# Patient Record
Sex: Female | Born: 1955 | Race: Black or African American | Hispanic: No | Smoking: Never smoker
Health system: Southern US, Community
[De-identification: ages and names within clinical notes are randomized; demographics above are authoritative.]

## PROBLEM LIST (undated history)

## (undated) DIAGNOSIS — Z9889 Other specified postprocedural states: Secondary | ICD-10-CM

## (undated) DIAGNOSIS — H409 Unspecified glaucoma: Secondary | ICD-10-CM

## (undated) HISTORY — DX: Other specified postprocedural states: Z98.890

---

## 1998-08-06 ENCOUNTER — Other Ambulatory Visit: Admission: RE | Admit: 1998-08-06 | Discharge: 1998-08-06 | Payer: Self-pay | Admitting: *Deleted

## 2000-02-08 ENCOUNTER — Other Ambulatory Visit: Admission: RE | Admit: 2000-02-08 | Discharge: 2000-02-08 | Payer: Self-pay | Admitting: *Deleted

## 2001-02-13 ENCOUNTER — Other Ambulatory Visit: Admission: RE | Admit: 2001-02-13 | Discharge: 2001-02-13 | Payer: Self-pay | Admitting: Obstetrics and Gynecology

## 2002-03-02 ENCOUNTER — Other Ambulatory Visit: Admission: RE | Admit: 2002-03-02 | Discharge: 2002-03-02 | Payer: Self-pay | Admitting: Obstetrics and Gynecology

## 2003-06-03 ENCOUNTER — Other Ambulatory Visit: Admission: RE | Admit: 2003-06-03 | Discharge: 2003-06-03 | Payer: Self-pay | Admitting: Obstetrics and Gynecology

## 2009-02-25 ENCOUNTER — Ambulatory Visit (HOSPITAL_BASED_OUTPATIENT_CLINIC_OR_DEPARTMENT_OTHER): Admission: RE | Admit: 2009-02-25 | Discharge: 2009-02-25 | Payer: Self-pay | Admitting: Orthopedic Surgery

## 2010-07-15 LAB — POCT HEMOGLOBIN-HEMACUE: Hemoglobin: 12.9 g/dL (ref 12.0–15.0)

## 2015-05-13 ENCOUNTER — Encounter (HOSPITAL_COMMUNITY): Payer: Self-pay | Admitting: *Deleted

## 2015-05-13 ENCOUNTER — Emergency Department (HOSPITAL_COMMUNITY)
Admission: EM | Admit: 2015-05-13 | Discharge: 2015-05-13 | Disposition: A | Discharge status: Home / Self Care | Payer: Federal, State, Local not specified - PPO | Attending: Emergency Medicine | Admitting: Emergency Medicine

## 2015-05-13 ENCOUNTER — Emergency Department (HOSPITAL_COMMUNITY): Payer: Federal, State, Local not specified - PPO

## 2015-05-13 DIAGNOSIS — S0093XA Contusion of unspecified part of head, initial encounter: Secondary | ICD-10-CM

## 2015-05-13 DIAGNOSIS — S060X0A Concussion without loss of consciousness, initial encounter: Secondary | ICD-10-CM | POA: Insufficient documentation

## 2015-05-13 DIAGNOSIS — Y9389 Activity, other specified: Secondary | ICD-10-CM | POA: Diagnosis not present

## 2015-05-13 DIAGNOSIS — Z79899 Other long term (current) drug therapy: Secondary | ICD-10-CM | POA: Insufficient documentation

## 2015-05-13 DIAGNOSIS — Y998 Other external cause status: Secondary | ICD-10-CM | POA: Insufficient documentation

## 2015-05-13 DIAGNOSIS — W2209XA Striking against other stationary object, initial encounter: Secondary | ICD-10-CM | POA: Diagnosis not present

## 2015-05-13 DIAGNOSIS — H409 Unspecified glaucoma: Secondary | ICD-10-CM | POA: Insufficient documentation

## 2015-05-13 DIAGNOSIS — S0990XA Unspecified injury of head, initial encounter: Secondary | ICD-10-CM | POA: Diagnosis present

## 2015-05-13 DIAGNOSIS — Y92481 Parking lot as the place of occurrence of the external cause: Secondary | ICD-10-CM | POA: Diagnosis not present

## 2015-05-13 DIAGNOSIS — S0083XA Contusion of other part of head, initial encounter: Secondary | ICD-10-CM | POA: Insufficient documentation

## 2015-05-13 HISTORY — DX: Unspecified glaucoma: H40.9

## 2015-05-13 MED ORDER — ACETAMINOPHEN 325 MG PO TABS
650.0000 mg | ORAL_TABLET | Freq: Once | ORAL | Status: AC
  Administered 2015-05-13: 650 mg via ORAL
  Filled 2015-05-13: qty 2

## 2015-05-13 NOTE — ED Notes (Signed)
Patient was alert, oriented and stable upon discharge. RN went over AVS and patient had no further questions.  

## 2015-05-13 NOTE — Discharge Instructions (Signed)
You were seen in the emergency room today for evaluation of a head injury. Your CT scans were normal. You likely have a mild concussion. You may take tylenol or Aleve at home as needed for headache. Please follow up with a primary care provider (see the list below if you do not have one) within one week. Return to the ER for new or worsening symptoms such as severe headache, intractable vomiting, etc.   Concussion, Adult A concussion, or closed-head injury, is a brain injury caused by a direct blow to the head or by a quick and sudden movement (jolt) of the head or neck. Concussions are usually not life-threatening. Even so, the effects of a concussion can be serious. If you have had a concussion before, you are more likely to experience concussion-like symptoms after a direct blow to the head.  CAUSES  Direct blow to the head, such as from running into another player during a soccer game, being hit in a fight, or hitting your head on a hard surface.  A jolt of the head or neck that causes the brain to move back and forth inside the skull, such as in a car crash. SIGNS AND SYMPTOMS The signs of a concussion can be hard to notice. Early on, they may be missed by you, family members, and health care providers. You may look fine but act or feel differently. Symptoms are usually temporary, but they may last for days, weeks, or even longer. Some symptoms may appear right away while others may not show up for hours or days. Every head injury is different. Symptoms include:  Mild to moderate headaches that will not go away.  A feeling of pressure inside your head.  Having more trouble than usual:  Learning or remembering things you have heard.  Answering questions.  Paying attention or concentrating.  Organizing daily tasks.  Making decisions and solving problems.  Slowness in thinking, acting or reacting, speaking, or reading.  Getting lost or being easily confused.  Feeling tired all the  time or lacking energy (fatigued).  Feeling drowsy.  Sleep disturbances.  Sleeping more than usual.  Sleeping less than usual.  Trouble falling asleep.  Trouble sleeping (insomnia).  Loss of balance or feeling lightheaded or dizzy.  Nausea or vomiting.  Numbness or tingling.  Increased sensitivity to:  Sounds.  Lights.  Distractions.  Vision problems or eyes that tire easily.  Diminished sense of taste or smell.  Ringing in the ears.  Mood changes such as feeling sad or anxious.  Becoming easily irritated or angry for little or no reason.  Lack of motivation.  Seeing or hearing things other people do not see or hear (hallucinations). DIAGNOSIS Your health care provider can usually diagnose a concussion based on a description of your injury and symptoms. He or she will ask whether you passed out (lost consciousness) and whether you are having trouble remembering events that happened right before and during your injury. Your evaluation might include:  A brain scan to look for signs of injury to the brain. Even if the test shows no injury, you may still have a concussion.  Blood tests to be sure other problems are not present. TREATMENT  Concussions are usually treated in an emergency department, in urgent care, or at a clinic. You may need to stay in the hospital overnight for further treatment.  Tell your health care provider if you are taking any medicines, including prescription medicines, over-the-counter medicines, and natural remedies. Some medicines,  such as blood thinners (anticoagulants) and aspirin, may increase the chance of complications. Also tell your health care provider whether you have had alcohol or are taking illegal drugs. This information may affect treatment.  Your health care provider will send you home with important instructions to follow.  How fast you will recover from a concussion depends on many factors. These factors include how  severe your concussion is, what part of your brain was injured, your age, and how healthy you were before the concussion.  Most people with mild injuries recover fully. Recovery can take time. In general, recovery is slower in older persons. Also, persons who have had a concussion in the past or have other medical problems may find that it takes longer to recover from their current injury. HOME CARE INSTRUCTIONS General Instructions  Carefully follow the directions your health care provider gave you.  Only take over-the-counter or prescription medicines for pain, discomfort, or fever as directed by your health care provider.  Take only those medicines that your health care provider has approved.  Do not drink alcohol until your health care provider says you are well enough to do so. Alcohol and certain other drugs may slow your recovery and can put you at risk of further injury.  If it is harder than usual to remember things, write them down.  If you are easily distracted, try to do one thing at a time. For example, do not try to watch TV while fixing dinner.  Talk with family members or close friends when making important decisions.  Keep all follow-up appointments. Repeated evaluation of your symptoms is recommended for your recovery.  Watch your symptoms and tell others to do the same. Complications sometimes occur after a concussion. Older adults with a brain injury may have a higher risk of serious complications, such as a blood clot on the brain.  Tell your teachers, school nurse, school counselor, coach, athletic trainer, or work Production designer, theatre/television/film about your injury, symptoms, and restrictions. Tell them about what you can or cannot do. They should watch for:  Increased problems with attention or concentration.  Increased difficulty remembering or learning new information.  Increased time needed to complete tasks or assignments.  Increased irritability or decreased ability to cope with  stress.  Increased symptoms.  Rest. Rest helps the brain to heal. Make sure you:  Get plenty of sleep at night. Avoid staying up late at night.  Keep the same bedtime hours on weekends and weekdays.  Rest during the day. Take daytime naps or rest breaks when you feel tired.  Limit activities that require a lot of thought or concentration. These include:  Doing homework or job-related work.  Watching TV.  Working on the computer.  Avoid any situation where there is potential for another head injury (football, hockey, soccer, basketball, martial arts, downhill snow sports and horseback riding). Your condition will get worse every time you experience a concussion. You should avoid these activities until you are evaluated by the appropriate follow-up health care providers. Returning To Your Regular Activities You will need to return to your normal activities slowly, not all at once. You must give your body and brain enough time for recovery.  Do not return to sports or other athletic activities until your health care provider tells you it is safe to do so.  Ask your health care provider when you can drive, ride a bicycle, or operate heavy machinery. Your ability to react may be slower after a brain injury.  Never do these activities if you are dizzy.  Ask your health care provider about when you can return to work or school. Preventing Another Concussion It is very important to avoid another brain injury, especially before you have recovered. In rare cases, another injury can lead to permanent brain damage, brain swelling, or death. The risk of this is greatest during the first 7-10 days after a head injury. Avoid injuries by:  Wearing a seat belt when riding in a car.  Drinking alcohol only in moderation.  Wearing a helmet when biking, skiing, skateboarding, skating, or doing similar activities.  Avoiding activities that could lead to a second concussion, such as contact or  recreational sports, until your health care provider says it is okay.  Taking safety measures in your home.  Remove clutter and tripping hazards from floors and stairways.  Use grab bars in bathrooms and handrails by stairs.  Place non-slip mats on floors and in bathtubs.  Improve lighting in dim areas. SEEK MEDICAL CARE IF:  You have increased problems paying attention or concentrating.  You have increased difficulty remembering or learning new information.  You need more time to complete tasks or assignments than before.  You have increased irritability or decreased ability to cope with stress.  You have more symptoms than before. Seek medical care if you have any of the following symptoms for more than 2 weeks after your injury:  Lasting (chronic) headaches.  Dizziness or balance problems.  Nausea.  Vision problems.  Increased sensitivity to noise or light.  Depression or mood swings.  Anxiety or irritability.  Memory problems.  Difficulty concentrating or paying attention.  Sleep problems.  Feeling tired all the time. SEEK IMMEDIATE MEDICAL CARE IF:  You have severe or worsening headaches. These may be a sign of a blood clot in the brain.  You have weakness (even if only in one hand, leg, or part of the face).  You have numbness.  You have decreased coordination.  You vomit repeatedly.  You have increased sleepiness.  One pupil is larger than the other.  You have convulsions.  You have slurred speech.  You have increased confusion. This may be a sign of a blood clot in the brain.  You have increased restlessness, agitation, or irritability.  You are unable to recognize people or places.  You have neck pain.  It is difficult to wake you up.  You have unusual behavior changes.  You lose consciousness. MAKE SURE YOU:  Understand these instructions.  Will watch your condition.  Will get help right away if you are not doing well or  get worse.   This information is not intended to replace advice given to you by your health care provider. Make sure you discuss any questions you have with your health care provider.   Document Released: 06/19/2003 Document Revised: 04/19/2014 Document Reviewed: 10/19/2012 Elsevier Interactive Patient Education 2016 [employer].  Take medications as prescribed. Return to the emergency room for worsening condition or new concerning symptoms. Follow up with your regular doctor. If you don't have a regular doctor use one of the numbers below to establish a primary care doctor.   Emergency Department Resource Guide 1) Find a Doctor and Pay Out of Pocket Although you won't have to find out who is covered by your insurance plan, it is a good idea to ask around and get recommendations. You will then need to call the office and see if the doctor you have chosen will accept  you as a new patient and what types of options they offer for patients who are self-pay. Some doctors offer discounts or will set up payment plans for their patients who do not have insurance, but you will need to ask so you aren't surprised when you get to your appointment.  2) Contact Your Local Health Department Not all health departments have doctors that can see patients for sick visits, but many do, so it is worth a call to see if yours does. If you don't know where your local health department is, you can check in your phone book. The CDC also has a tool to help you locate your state's health department, and many state websites also have listings of all of their local health departments.  3) Find a Walk-in Clinic If your illness is not likely to be very severe or complicated, you may want to try a walk in clinic. These are popping up all over the country in pharmacies, drugstores, and shopping centers. They're usually staffed by nurse practitioners or physician assistants that have been trained to treat common illnesses and  complaints. They're usually fairly quick and inexpensive. However, if you have serious medical issues or chronic medical problems, these are probably not your best option.  No Primary Care Doctor: - Call Health Connect at  [phone] - they can help you locate a primary care doctor that  accepts your insurance, provides certain services, etc. - Physician Referral [phone]  Emergency Department Resource Guide 1) Find a Doctor and Pay Out of Pocket Although you won't have to find out who is covered by your insurance plan, it is a good idea to ask around and get recommendations. You will then need to call the office and see if the doctor you have chosen will accept you as a new patient and what types of options they offer for patients who are self-pay. Some doctors offer discounts or will set up payment plans for their patients who do not have insurance, but you will need to ask so you aren't surprised when you get to your appointment.  2) Contact Your Local Health Department Not all health departments have doctors that can see patients for sick visits, but many do, so it is worth a call to see if yours does. If you don't know where your local health department is, you can check in your phone book. The CDC also has a tool to help you locate your state's health department, and many state websites also have listings of all of their local health departments.  3) Find a Walk-in Clinic If your illness is not likely to be very severe or complicated, you may want to try a walk in clinic. These are popping up all over the country in pharmacies, drugstores, and shopping centers. They're usually staffed by nurse practitioners or physician assistants that have been trained to treat common illnesses and complaints. They're usually fairly quick and inexpensive. However, if you have serious medical issues or chronic medical problems, these are probably not your best option.  No Primary Care  Doctor: - Call Health Connect at  [phone] - they can help you locate a primary care doctor that  accepts your insurance, provides certain services, etc. - Physician Referral Service- [phone]  Chronic Pain Problems: Organization         Address  Phone   Notes  [facility]  [phone] Patients need to be referred by their primary care doctor.   Medication  Assistance: Organization         Address  Phone   Notes  [facility] Medication Assistance Program [address] [place], [place] [address] [phone] --Must be a resident of [facility] -- Must have NO insurance coverage whatsoever (no Medicaid/ Medicare, etc.) -- The pt. MUST have a primary care doctor that directs their care regularly and follows them in the community   [facility]  [phone]   [employer]  [phone]    Agencies that provide inexpensive medical care: Organization         Address  Phone   Notes  [facility]  [phone]   [facility]    [phone]   [facility] [address] [place], [place] [address] [phone]   [facility] [address], [place] [phone]   [facility]    [phone]   [facility]    [phone]   [facility]  [address], [place] Phone:  [phone], Fax:  [phone] Hours of Operation:  9 am - 6 pm, M-F.  Also accepts Medicaid/Medicare and self-pay.  [facility]  [address], [place] Phone: [phone], Fax: [phone]. Hours of Operation:  8:30 am - 5:30 pm, M-F.  Also accepts Medicaid and self-pay.  [facility] [place] [address] [place] Phone: [phone]   [facility] [address] Natasha Bence [place], [place] [phone], Ext. [phone] Mondays & Thursdays: 7-9 AM.  First 15 patients are seen on a first  come, first serve basis.    Medicaid-accepting [facility] Providers:  Organization         Address  Phone   Notes  [facility] [address], [place] [phone] Also accepts self-pay patients.  [facility] [address] Laurell Josephs [place], [place]  [phone]   [facility] [address], [place] [phone]   [facility] [address] [place] [phone]   Renaye Rakers [address], [place]   [phone] Only accepts [place] patients after they have their name applied to their card.   Self-Pay (no insurance) in [facility]:  Organization         Address  Phone   Notes  Sickle Cell Patients, [facility] [address] [place], [place] [phone]   [facility] [address] [place], [place] [phone]   [facility] [place]  [address], [place] [phone]   [facility]/Dr. Osei-Bonsu  [address], [place] or [address], [place] [phone] Phone number for both [place] and [place] locations is the same.  [facility] [address], [place] [phone]   [facility] [address] [place] or [address] [phone]   [facility] [address], [place] [phone], phone; [phone], fax Sees patients 1st and 3rd Saturday of every month.  Must not qualify for public or private insurance (i.e. Medicaid, Medicare, [place] Health Choice, Veterans' Benefits)  Household income should be no more than 200% of the poverty level The clinic cannot  treat you if you are pregnant or think you are pregnant  Sexually transmitted diseases are not treated at the clinic.
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
## 2017-09-07 ENCOUNTER — Emergency Department (HOSPITAL_COMMUNITY)
Admission: EM | Admit: 2017-09-07 | Discharge: 2017-09-07 | Disposition: A | Discharge status: Home / Self Care | Payer: Federal, State, Local not specified - PPO | Attending: Emergency Medicine | Admitting: Emergency Medicine

## 2017-09-07 ENCOUNTER — Other Ambulatory Visit: Payer: Self-pay

## 2017-09-07 ENCOUNTER — Emergency Department (HOSPITAL_COMMUNITY): Payer: Federal, State, Local not specified - PPO

## 2017-09-07 DIAGNOSIS — Z79899 Other long term (current) drug therapy: Secondary | ICD-10-CM | POA: Insufficient documentation

## 2017-09-07 DIAGNOSIS — S8252XA Displaced fracture of medial malleolus of left tibia, initial encounter for closed fracture: Secondary | ICD-10-CM | POA: Diagnosis not present

## 2017-09-07 DIAGNOSIS — Y929 Unspecified place or not applicable: Secondary | ICD-10-CM | POA: Diagnosis not present

## 2017-09-07 DIAGNOSIS — W109XXA Fall (on) (from) unspecified stairs and steps, initial encounter: Secondary | ICD-10-CM | POA: Insufficient documentation

## 2017-09-07 DIAGNOSIS — S99912A Unspecified injury of left ankle, initial encounter: Secondary | ICD-10-CM | POA: Diagnosis present

## 2017-09-07 DIAGNOSIS — Y998 Other external cause status: Secondary | ICD-10-CM | POA: Insufficient documentation

## 2017-09-07 DIAGNOSIS — S92155A Nondisplaced avulsion fracture (chip fracture) of left talus, initial encounter for closed fracture: Secondary | ICD-10-CM

## 2017-09-07 DIAGNOSIS — Y93K1 Activity, walking an animal: Secondary | ICD-10-CM | POA: Diagnosis not present

## 2017-09-07 MED ORDER — OXYCODONE-ACETAMINOPHEN 5-325 MG PO TABS: 1.0000 | ORAL_TABLET | ORAL | 0 refills | Status: AC | PRN

## 2017-09-07 MED ORDER — OXYCODONE-ACETAMINOPHEN 5-325 MG PO TABS
1.0000 | ORAL_TABLET | Freq: Once | ORAL | Status: AC
  Administered 2017-09-07: 1 via ORAL
  Filled 2017-09-07: qty 1

## 2017-09-07 NOTE — ED Triage Notes (Addendum)
Patient reports that she was taking her dog out and stepped off of the deck wrong and is now having left ankle pain and swelling. Patient also reports pain and tightness of the right ankle.

## 2017-09-07 NOTE — ED Notes (Signed)
Bed: WTR7 Expected date:  Expected time:  Means of arrival:  Comments: 

## 2017-09-07 NOTE — ED Notes (Signed)
Pt reports that she was walking her dog and fell walking her dog. Pt has left ankle swelling 7/10 stiff ache [ain and rt ankle pt reports tingling and /burning sensation. Pt is alert and oriented x 4 and is verbally responsive. LE elevated and ice applied + PMS x 4.

## 2017-09-07 NOTE — Discharge Instructions (Addendum)
You have two small fractures of your left ankle  Please call and schedule an appointment with the bone doctor (information listed below) for follow-up and management.  Please use the boot provided in the ER.  I have written you prescription for pain medicine.  This medicine can make you drowsy so please do not drive or work while taking it.  As we discussed ice the ankle at least 15 minutes at a time twice a day.  Elevate the ankle when possible.  Your blood pressure was elevated in the ER today, please have this rechecked by her regular doctor.  Return to the ER if you have any new or concerning symptoms.

## 2017-09-07 NOTE — ED Provider Notes (Signed)
[facility]-EMERGENCY DEPT Provider Note   CSN: [id number] Arrival date & time: 09/07/17  0803     History   Chief Complaint Chief Complaint  Patient presents with  . Ankle Pain    HPI Krystal Fuller is a 62 y.o. female.  HPI   Krystal Fuller is a 62 year old female with no significant past medical history who presents to the emergency department for evaluation of left ankle injury.  Patient reports that she was walking her dog early this morning and accidentally fell forward over a step.  She does not remember exactly how she fell, but denies hitting her head or loss of consciousness.  States that she thinks she twisted the ankle, but not sure of what direction.  Reports that she has 8/10 severity "aching" left ankle pain which is primarily located over the medial aspect of the joint.  Pain is worsened with weightbearing.  She has iced the ankle with mild improvement in pain.  Has not taken any over-the-counter medications for her symptoms.  She also reports right ankle tightness, but denies pain.  She denies fever, chills, wound, numbness, weakness.  Denies prior ankle surgeries.  Past Medical History:  Diagnosis Date  . Glaucoma     There are no active problems to display for this patient.   No past surgical history on file.   OB History   None      Home Medications    Prior to Admission medications   Medication Sig Start Date End Date Taking? Authorizing Provider  ALPHAGAN P 0.1 % SOLN Place 1 drop into both eyes every morning. 05/05/15   [provider]  dorzolamide-timolol (COSOPT) 22.3-6.8 MG/ML ophthalmic solution Place 1 drop into both eyes every morning. 02/26/15   [provider]  latanoprost (XALATAN) 0.005 % ophthalmic solution Place 1 drop into both eyes at bedtime. 05/07/15   [provider]  naproxen sodium (ANAPROX) 220 MG tablet Take 440 mg by mouth 2 (two) times daily as needed (pain).    [provider]  Travoprost, BAK Free, (TRAVATAN) 0.004 % SOLN ophthalmic solution Place 1 drop into both eyes at bedtime.    [provider]    Family History No family history on file.  Social History Social History   Tobacco Use  . Smoking status: Never Smoker  Substance Use Topics  . Alcohol use: No  . Drug use: No     Allergies   Diovan [valsartan] and Shellfish allergy   Review of Systems Review of Systems  Constitutional: Negative for chills and fever.  Musculoskeletal: Positive for arthralgias (left ankle), gait problem and joint swelling (left ankle).  Skin: Negative for color change and wound.  Neurological: Negative for headaches.     Physical Exam Updated Vital Signs BP (!) 157/82 (BP Location: Right Arm)   Pulse 75   Temp 97.9 F (36.6 C) (Oral)   Resp 17   Ht  (1.676 m)   Wt 99.8 kg (220 lb)   SpO2 98%   BMI 35.51 kg/m   Physical Exam  Constitutional: She appears well-developed and well-nourished. No distress.  HENT:  Head: Normocephalic and atraumatic.  Eyes: Right eye exhibits no discharge. Left eye exhibits no discharge.  Pulmonary/Chest: Effort normal. No respiratory distress.  Musculoskeletal:  Left ankle with tenderness over the medial malleolus. Swelling noted over the medial and lateral aspect of the ankle joint. No erythema, ecchymosis, break in skin or deformity appreciated. Full active dorsiflexion and  plantarflexion. No pain to fifth metatarsal area or navicular region. Achilles intact. Right ankle without swelling, erythema, ecchymosis, break in skin or deformity.  Nontender to palpation.  Full active dorsiflexion and plantarflexion.  Achilles intact.  DP pulses 2+ bilaterally.  Sensation to light touch intact in bilateral lower extremities.  Neurological: She is alert. Coordination normal.  Skin: Skin is warm and dry. Capillary refill takes less than 2 seconds. She is not diaphoretic.  Psychiatric: She has a normal mood  and affect. Her behavior is normal.  Nursing note and vitals reviewed.    ED Treatments / Results  Labs (all labs ordered are listed, but only abnormal results are displayed) Labs Reviewed - No data to display  EKG None  Radiology Dg Ankle Complete Left  Result Date: 09/07/2017 CLINICAL DATA:  The patient stepped wrong off a deck and now has left ankle pain and swelling with milder pain on the right. EXAM: LEFT ANKLE COMPLETE - 3+ VIEW COMPARISON:  None in PACs FINDINGS: The patient has sustained an acute avulsion from the tip of the medial malleolus. The posterior and lateral malleoli are intact. The ankle joint mortise is preserved. The talar dome is intact. A tiny avulsion fracture from the lateral aspect of the talus is suspected. The calcaneus is intact and exhibits plantar and Achilles region spurs. There is moderate diffuse soft tissue swelling. IMPRESSION: There is an avulsion from the tip of the medial malleolus. There is a smaller avulsion from the lateral aspect of the talus. There is diffuse soft tissue swelling. No dislocation. Electronically Signed   By: Krystal  Fuller M.D.   On: 09/07/2017 10:01   Dg Ankle Complete Right  Result Date: 09/07/2017 CLINICAL DATA:  Pain and tightness in the right ankle after stepping wrong off a deck. EXAM: RIGHT ANKLE - COMPLETE 3+ VIEW COMPARISON:  None in PACs FINDINGS: The bones are subjectively adequately mineralized. There is no acute fracture nor dislocation. The joint mortise is preserved. The talar dome is intact. There is a plantar calcaneal spur. There is soft tissue swelling anteriorly and laterally. IMPRESSION: No acute fracture or dislocation. Soft tissue swelling anteriorly and laterally. Electronically Signed   By: Krystal  Fuller M.D.   On: 09/07/2017 09:59    Procedures Procedures (including critical care time)  Medications Ordered in ED Medications  oxyCODONE-acetaminophen (PERCOCET/ROXICET) 5-325 MG per tablet 1 tablet (1  tablet Oral Given 09/07/17 1228)     Initial Impression / Assessment and Plan / ED Course  I have reviewed the triage vital signs and the nursing notes.  Pertinent labs & imaging results that were available during my care of the patient were reviewed by me and considered in my medical decision making (see chart for details).    X-ray left ankle reveals small medial malleolus avulsion fracture.  Another smaller avulsion fracture at the lateral aspect of the left talus.  X-ray right ankle without acute abnormality.  On exam bilateral lower extremities neurovascularly intact.  No wound, erythema, warmth or signs of infection.  Patient will be discharged with cam walker.  Given instructions to follow-up with orthopedics.  Counseled her on RICE protocol and will discharge with a short course of pain management.  Counseled her that this medicine can make her drowsy and she should not drive or work while taking it.  Her blood pressure was elevated in the ER today, counseled her to have this rechecked by her PCP.  Patient agrees and voiced understanding to the above plan.  Final Clinical Impressions(s) / ED Diagnoses   Final diagnoses:  Closed avulsion fracture of medial malleolus of left tibia, initial encounter  Closed nondisplaced avulsion fracture of left talus, initial encounter    ED Discharge Orders    None       Lawrence Marseilles 09/07/17 1230    Mancel Bale, MD 09/08/17 1425

## 2019-08-19 IMAGING — CR DG ANKLE COMPLETE 3+V*L*
3 series · 3 of 3 positions shown · non-contrast
Comparison: None in PACs

CLINICAL DATA: The patient stepped wrong off a deck and now has
left ankle pain and swelling with milder pain on the right.

EXAM:
LEFT ANKLE COMPLETE - 3+ VIEW

[x ankle ap left]
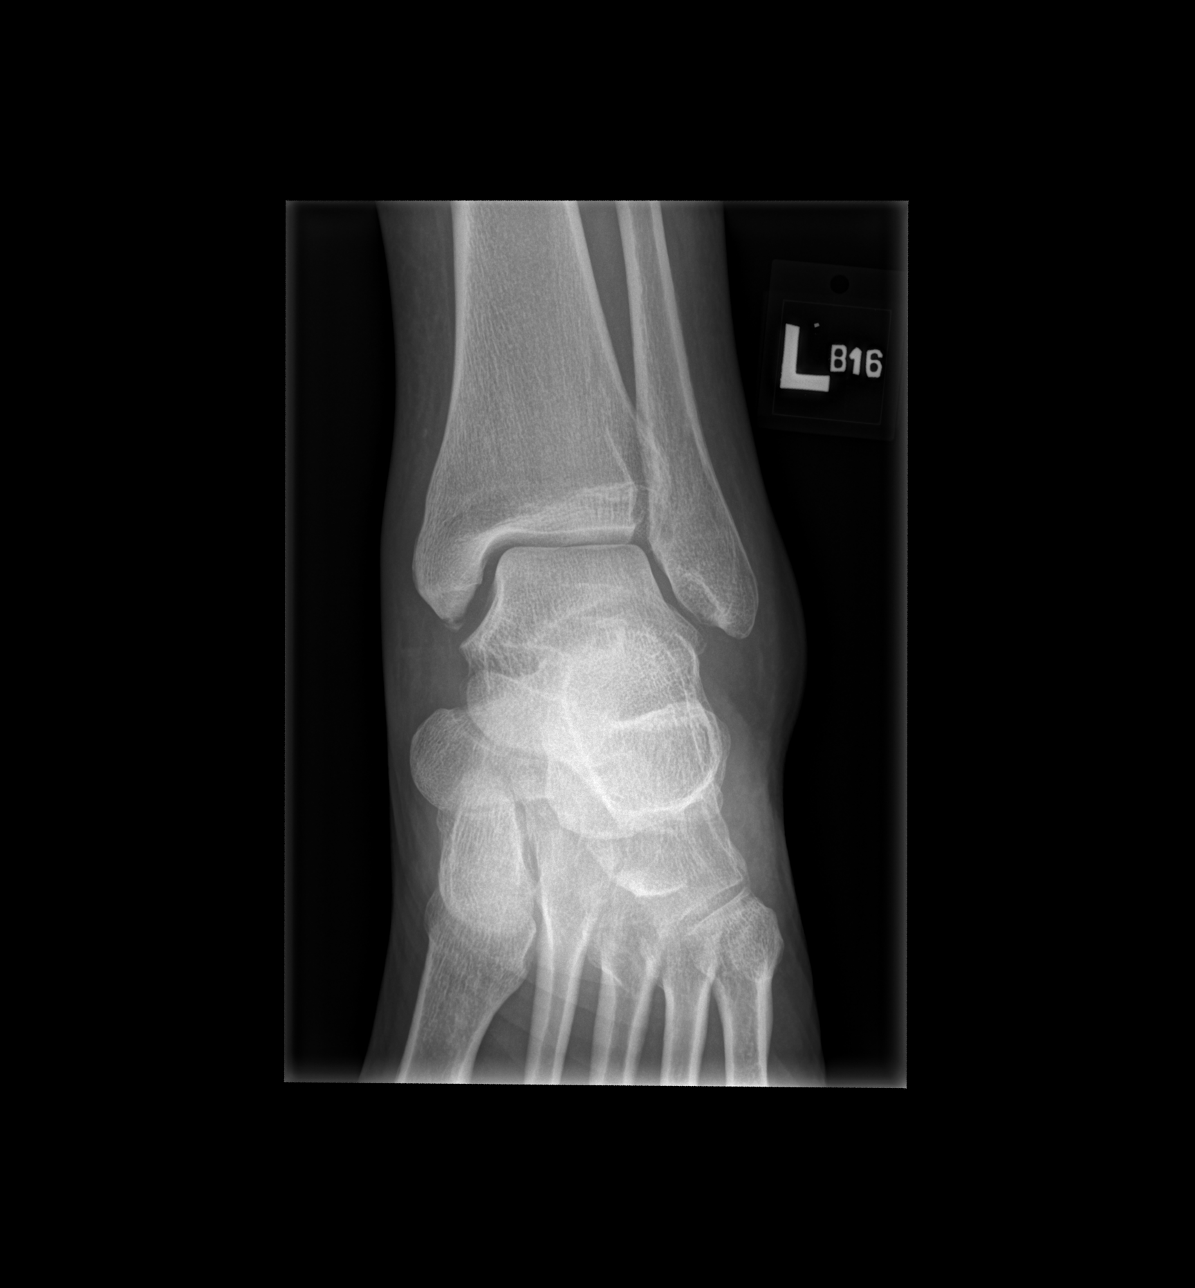

[x ankle obl left]
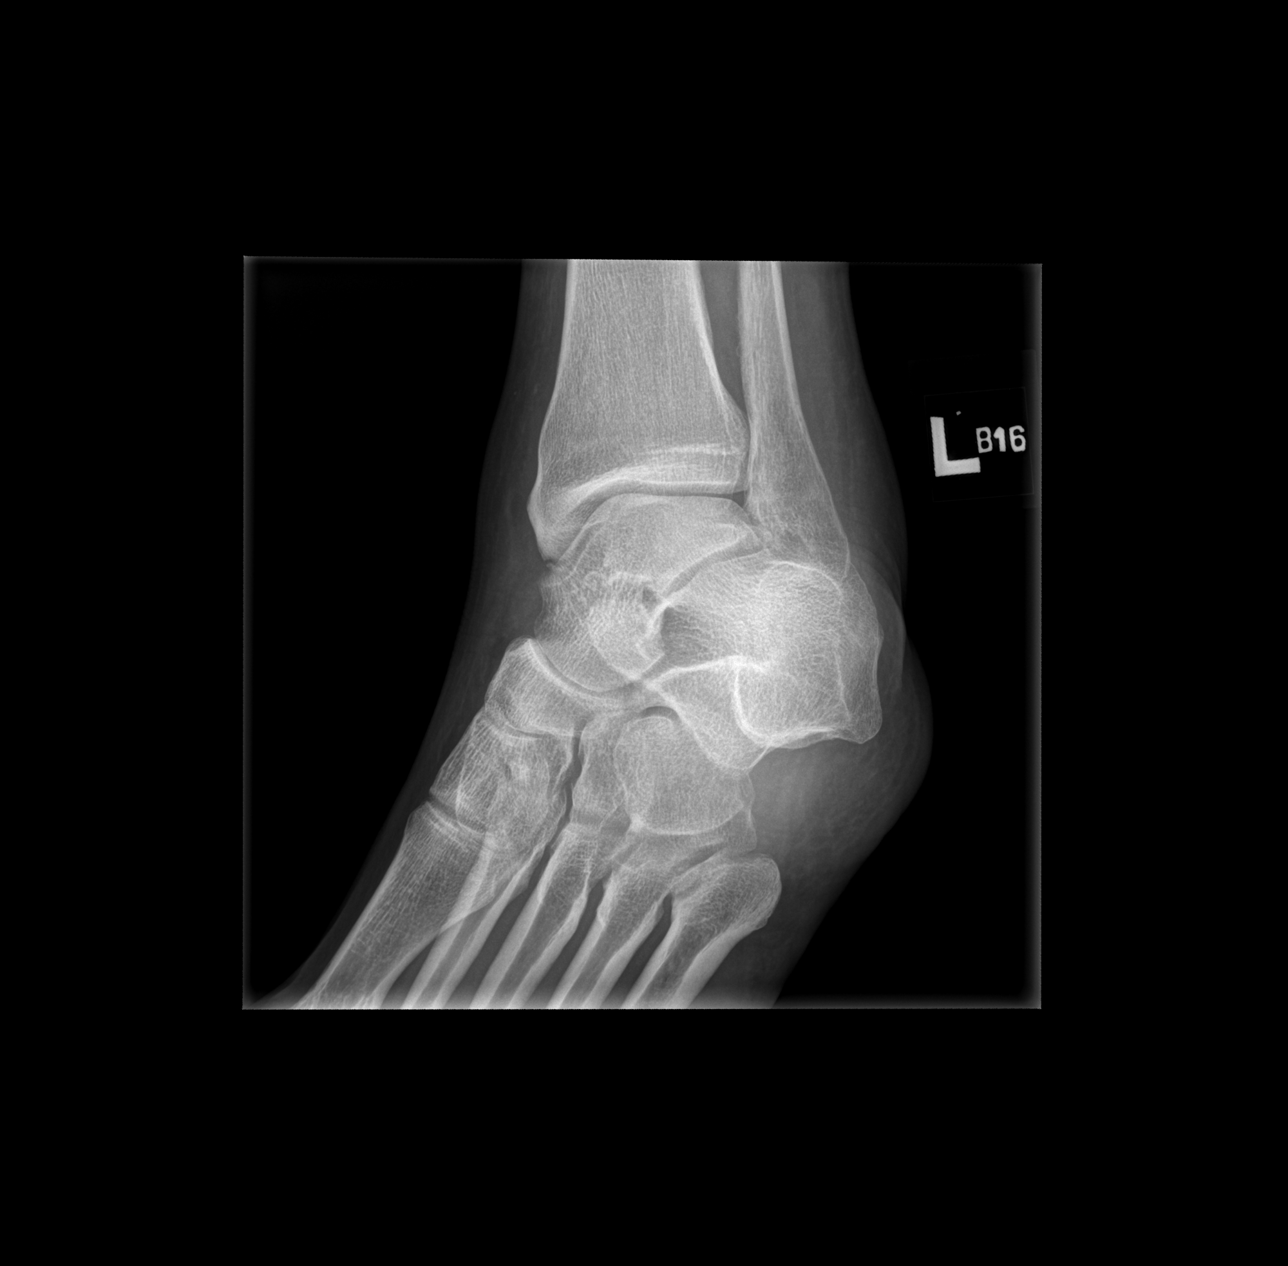

[x ankle lat left]
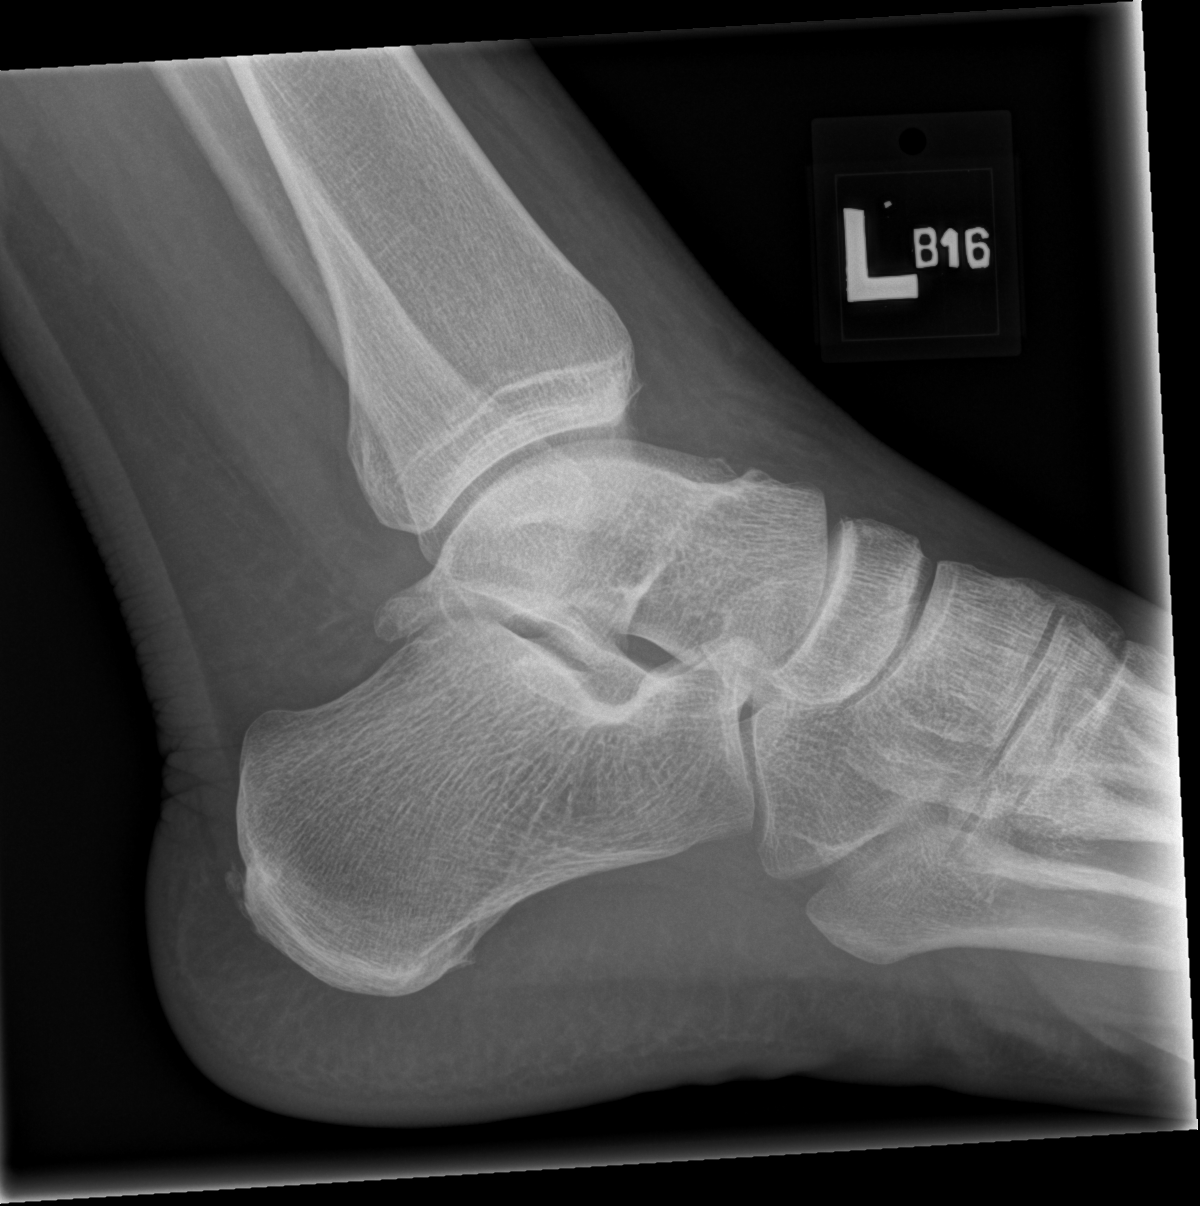

[3 of 3 positions shown; findings below may reference images not displayed]

FINDINGS: The patient has sustained an acute avulsion from the tip of the
medial malleolus. The posterior and lateral malleoli are intact. The
ankle joint mortise is preserved. The talar dome is intact. A tiny
avulsion fracture from the lateral aspect of the talus is suspected.
The calcaneus is intact and exhibits plantar and Achilles region
spurs. There is moderate diffuse soft tissue swelling.
IMPRESSION: There is an avulsion from the tip of the medial malleolus. There is
a smaller avulsion from the lateral aspect of the talus. There is
diffuse soft tissue swelling. No dislocation.

## 2021-05-01 DIAGNOSIS — H401133 Primary open-angle glaucoma, bilateral, severe stage: Secondary | ICD-10-CM | POA: Diagnosis not present

## 2021-05-07 DIAGNOSIS — H401133 Primary open-angle glaucoma, bilateral, severe stage: Secondary | ICD-10-CM | POA: Diagnosis not present

## 2021-05-07 DIAGNOSIS — H25813 Combined forms of age-related cataract, bilateral: Secondary | ICD-10-CM | POA: Diagnosis not present

## 2021-05-07 DIAGNOSIS — H43813 Vitreous degeneration, bilateral: Secondary | ICD-10-CM | POA: Diagnosis not present

## 2021-05-12 DIAGNOSIS — E669 Obesity, unspecified: Secondary | ICD-10-CM | POA: Diagnosis not present

## 2021-05-12 DIAGNOSIS — H409 Unspecified glaucoma: Secondary | ICD-10-CM | POA: Diagnosis not present

## 2021-06-23 DIAGNOSIS — H4089 Other specified glaucoma: Secondary | ICD-10-CM | POA: Diagnosis not present

## 2021-06-23 DIAGNOSIS — Z13220 Encounter for screening for lipoid disorders: Secondary | ICD-10-CM | POA: Diagnosis not present

## 2021-06-23 DIAGNOSIS — R899 Unspecified abnormal finding in specimens from other organs, systems and tissues: Secondary | ICD-10-CM | POA: Diagnosis not present

## 2021-06-23 DIAGNOSIS — Z13820 Encounter for screening for osteoporosis: Secondary | ICD-10-CM | POA: Diagnosis not present

## 2021-06-23 DIAGNOSIS — Z833 Family history of diabetes mellitus: Secondary | ICD-10-CM | POA: Diagnosis not present

## 2021-06-23 DIAGNOSIS — Z1211 Encounter for screening for malignant neoplasm of colon: Secondary | ICD-10-CM | POA: Diagnosis not present

## 2021-06-24 DIAGNOSIS — Z136 Encounter for screening for cardiovascular disorders: Secondary | ICD-10-CM | POA: Diagnosis not present

## 2021-06-24 DIAGNOSIS — Z13220 Encounter for screening for lipoid disorders: Secondary | ICD-10-CM | POA: Diagnosis not present

## 2021-06-24 DIAGNOSIS — R899 Unspecified abnormal finding in specimens from other organs, systems and tissues: Secondary | ICD-10-CM | POA: Diagnosis not present

## 2021-06-24 DIAGNOSIS — Z833 Family history of diabetes mellitus: Secondary | ICD-10-CM | POA: Diagnosis not present

## 2021-06-24 DIAGNOSIS — Z131 Encounter for screening for diabetes mellitus: Secondary | ICD-10-CM | POA: Diagnosis not present

## 2021-06-25 ENCOUNTER — Other Ambulatory Visit: Payer: Self-pay | Admitting: Internal Medicine

## 2021-06-29 ENCOUNTER — Other Ambulatory Visit: Payer: Self-pay | Admitting: Internal Medicine

## 2021-06-29 DIAGNOSIS — Z1231 Encounter for screening mammogram for malignant neoplasm of breast: Secondary | ICD-10-CM

## 2021-09-09 DIAGNOSIS — H25813 Combined forms of age-related cataract, bilateral: Secondary | ICD-10-CM | POA: Diagnosis not present

## 2021-09-09 DIAGNOSIS — H401133 Primary open-angle glaucoma, bilateral, severe stage: Secondary | ICD-10-CM | POA: Diagnosis not present

## 2021-09-09 DIAGNOSIS — H43813 Vitreous degeneration, bilateral: Secondary | ICD-10-CM | POA: Diagnosis not present

## 2021-09-24 DIAGNOSIS — E7800 Pure hypercholesterolemia, unspecified: Secondary | ICD-10-CM | POA: Diagnosis not present

## 2021-09-24 DIAGNOSIS — E669 Obesity, unspecified: Secondary | ICD-10-CM | POA: Diagnosis not present

## 2021-09-24 DIAGNOSIS — H4089 Other specified glaucoma: Secondary | ICD-10-CM | POA: Diagnosis not present

## 2021-09-24 DIAGNOSIS — Z0000 Encounter for general adult medical examination without abnormal findings: Secondary | ICD-10-CM | POA: Diagnosis not present

## 2021-09-24 DIAGNOSIS — R7303 Prediabetes: Secondary | ICD-10-CM | POA: Diagnosis not present

## 2021-11-24 DIAGNOSIS — K648 Other hemorrhoids: Secondary | ICD-10-CM | POA: Diagnosis not present

## 2021-11-24 DIAGNOSIS — K5730 Diverticulosis of large intestine without perforation or abscess without bleeding: Secondary | ICD-10-CM | POA: Diagnosis not present

## 2021-11-24 DIAGNOSIS — Z1211 Encounter for screening for malignant neoplasm of colon: Secondary | ICD-10-CM | POA: Diagnosis not present

## 2021-11-24 DIAGNOSIS — K635 Polyp of colon: Secondary | ICD-10-CM | POA: Diagnosis not present

## 2021-12-10 ENCOUNTER — Ambulatory Visit
Admission: RE | Admit: 2021-12-10 | Discharge: 2021-12-10 | Disposition: A | Discharge status: Home / Self Care | Payer: PPO | Source: Ambulatory Visit | Attending: Internal Medicine | Admitting: Internal Medicine

## 2021-12-10 DIAGNOSIS — Z1231 Encounter for screening mammogram for malignant neoplasm of breast: Secondary | ICD-10-CM

## 2021-12-10 DIAGNOSIS — Z780 Asymptomatic menopausal state: Secondary | ICD-10-CM | POA: Diagnosis not present

## 2021-12-10 DIAGNOSIS — Z13820 Encounter for screening for osteoporosis: Secondary | ICD-10-CM

## 2021-12-28 DIAGNOSIS — Z6731 Type AB blood, Rh negative: Secondary | ICD-10-CM | POA: Diagnosis not present

## 2021-12-28 DIAGNOSIS — Z23 Encounter for immunization: Secondary | ICD-10-CM | POA: Diagnosis not present

## 2021-12-28 DIAGNOSIS — E669 Obesity, unspecified: Secondary | ICD-10-CM | POA: Diagnosis not present

## 2021-12-28 DIAGNOSIS — R7303 Prediabetes: Secondary | ICD-10-CM | POA: Diagnosis not present

## 2021-12-28 DIAGNOSIS — Z6831 Body mass index (BMI) 31.0-31.9, adult: Secondary | ICD-10-CM | POA: Diagnosis not present

## 2021-12-28 DIAGNOSIS — I10 Essential (primary) hypertension: Secondary | ICD-10-CM | POA: Diagnosis not present

## 2021-12-28 DIAGNOSIS — E7800 Pure hypercholesterolemia, unspecified: Secondary | ICD-10-CM | POA: Diagnosis not present

## 2022-05-12 DIAGNOSIS — H43813 Vitreous degeneration, bilateral: Secondary | ICD-10-CM | POA: Diagnosis not present

## 2022-05-12 DIAGNOSIS — H401133 Primary open-angle glaucoma, bilateral, severe stage: Secondary | ICD-10-CM | POA: Diagnosis not present

## 2022-05-12 DIAGNOSIS — H25813 Combined forms of age-related cataract, bilateral: Secondary | ICD-10-CM | POA: Diagnosis not present

## 2023-09-09 ENCOUNTER — Emergency Department (HOSPITAL_BASED_OUTPATIENT_CLINIC_OR_DEPARTMENT_OTHER)
Admission: EM | Admit: 2023-09-09 | Discharge: 2023-09-09 | Discharge status: Against Medical Advice | Attending: Emergency Medicine | Admitting: Emergency Medicine

## 2023-09-09 ENCOUNTER — Other Ambulatory Visit: Payer: Self-pay

## 2023-09-09 ENCOUNTER — Emergency Department (HOSPITAL_BASED_OUTPATIENT_CLINIC_OR_DEPARTMENT_OTHER)

## 2023-09-09 ENCOUNTER — Encounter (HOSPITAL_BASED_OUTPATIENT_CLINIC_OR_DEPARTMENT_OTHER): Payer: Self-pay

## 2023-09-09 DIAGNOSIS — X58XXXA Exposure to other specified factors, initial encounter: Secondary | ICD-10-CM | POA: Insufficient documentation

## 2023-09-09 DIAGNOSIS — Z5321 Procedure and treatment not carried out due to patient leaving prior to being seen by health care provider: Secondary | ICD-10-CM | POA: Insufficient documentation

## 2023-09-09 DIAGNOSIS — S60222A Contusion of left hand, initial encounter: Secondary | ICD-10-CM | POA: Diagnosis present

## 2023-09-09 NOTE — ED Triage Notes (Signed)
 Arrives POV with complaints of left hand pain with some bruising. No falls or injuries. She started to notice some bruising her right hand as well.

## 2023-09-09 NOTE — ED Notes (Signed)
Pt called for room x2. No answer.
